# Patient Record
Sex: Female | Born: 2000 | Hispanic: No | Marital: Single
Health system: Southern US, Community
[De-identification: ages and names within clinical notes are randomized; demographics above are authoritative.]

---

## 2019-09-17 ENCOUNTER — Encounter: Payer: Self-pay | Admitting: Emergency Medicine

## 2019-09-17 ENCOUNTER — Emergency Department
Admission: EM | Admit: 2019-09-17 | Discharge: 2019-09-17 | Disposition: A | Discharge status: Home / Self Care | Payer: Medicaid Other | Attending: Emergency Medicine | Admitting: Emergency Medicine

## 2019-09-17 ENCOUNTER — Other Ambulatory Visit: Payer: Self-pay

## 2019-09-17 ENCOUNTER — Emergency Department: Payer: Medicaid Other

## 2019-09-17 DIAGNOSIS — R102 Pelvic and perineal pain: Secondary | ICD-10-CM | POA: Insufficient documentation

## 2019-09-17 LAB — URINALYSIS, COMPLETE (UACMP) WITH MICROSCOPIC
Bacteria, UA: NONE SEEN
Bilirubin Urine: NEGATIVE
Glucose, UA: NEGATIVE mg/dL
Ketones, ur: 5 mg/dL — AB
Leukocytes,Ua: NEGATIVE
Nitrite: NEGATIVE
Protein, ur: 100 mg/dL — AB
RBC / HPF: >50 RBC/hpf — ABNORMAL HIGH (ref 0–5)
Specific Gravity, Urine: 1.024 (ref 1.005–1.030)
pH: 5 (ref 5.0–8.0)

## 2019-09-17 LAB — COMPREHENSIVE METABOLIC PANEL
ALT: 12 U/L (ref 0–44)
AST: 15 U/L (ref 15–41)
Albumin: 4.5 g/dL (ref 3.5–5.0)
Alkaline Phosphatase: 68 U/L (ref 38–126)
Anion gap: 9 (ref 5–15)
BUN: 10 mg/dL (ref 6–20)
CO2: 24 mmol/L (ref 22–32)
Calcium: 9.1 mg/dL (ref 8.9–10.3)
Chloride: 104 mmol/L (ref 98–111)
Creatinine, Ser: 0.67 mg/dL (ref 0.44–1.00)
GFR calc Af Amer: >60 mL/min (ref >60)
GFR calc non Af Amer: >60 mL/min (ref >60)
Glucose, Bld: 95 mg/dL (ref 70–99)
Potassium: 3.8 mmol/L (ref 3.5–5.1)
Sodium: 137 mmol/L (ref 135–145)
Total Bilirubin: 0.8 mg/dL (ref 0.3–1.2)
Total Protein: 8 g/dL (ref 6.5–8.1)

## 2019-09-17 LAB — CBC
HCT: 43.4 % (ref 36.0–46.0)
Hemoglobin: 14.2 g/dL (ref 12.0–15.0)
MCH: 28.2 pg (ref 26.0–34.0)
MCHC: 32.7 g/dL (ref 30.0–36.0)
MCV: 86.1 fL (ref 80.0–100.0)
Platelets: 278 10*3/uL (ref 150–400)
RBC: 5.04 MIL/uL (ref 3.87–5.11)
RDW: 14.1 % (ref 11.5–15.5)
WBC: 6.4 10*3/uL (ref 4.0–10.5)
nRBC: 0 % (ref 0.0–0.2)

## 2019-09-17 LAB — POCT PREGNANCY, URINE: Preg Test, Ur: NEGATIVE

## 2019-09-17 LAB — LIPASE, BLOOD: Lipase: 21 U/L (ref 11–51)

## 2019-09-17 MED ORDER — IBUPROFEN 800 MG PO TABS: 800.0000 mg | ORAL_TABLET | Freq: Three times a day (TID) | ORAL | 0 refills | Status: AC | PRN

## 2019-09-17 NOTE — ED Notes (Signed)
PT to restroom

## 2019-09-17 NOTE — ED Triage Notes (Addendum)
Pt comes POV with lower abdominal pain for 3 weeks. Pt crying in triage. Stating that she has seen her doctor and he didn't do anything. Pt states the pain has been getting worse since then. Pt also states that her period started this morning.

## 2019-09-17 NOTE — ED Notes (Signed)
Pt c/o pelvic pain that started 3 weeks ago that has gotten worse since her period started today. Pt states she was seen 4-5 days ago by doctor who told her it could be ovarian cyst. Pt states the pain is worse when she stands.

## 2019-09-17 NOTE — ED Provider Notes (Signed)
Emergency Department Provider Note  ____________________________________________  Time seen: Approximately 3:13 PM  I have reviewed the triage vital signs and the nursing notes.   HISTORY  Chief Complaint Abdominal Pain   Historian Patient     HPI Andrea Atkins is a 19 y.o. female presents to the emergency department with left-sided pelvic pain for the past 3 weeks.  Patient states that she has addressed pelvic pain with her primary care provider and has a follow-up appointment in April but states that pain is pain worse over the past 24 hours.  She denies nausea or vomiting.  No fever at home.  No dysuria, hematuria or increased urinary frequency.  She denies low back pain.  She reports that her menses started yesterday.  She denies a history of ovarian torsion or ovarian cyst.  No dyspareunia or changes in vaginal discharge.  No other alleviating measures have been attempted.   History reviewed. No pertinent past medical history.   Immunizations up to date:  Yes.     History reviewed. No pertinent past medical history.  There are no problems to display for this patient.   History reviewed. No pertinent surgical history.  Prior to Admission medications   Medication Sig Start Date End Date Taking? Authorizing Provider  ibuprofen (ADVIL) 800 MG tablet Take 1 tablet (800 mg total) by mouth every 8 (eight) hours as needed. 09/17/19   Orvil Feil, PA-C    Allergies Patient has no known allergies.  History reviewed. No pertinent family history.  Social History Social History   Tobacco Use  . Smoking status: Not on file  Substance Use Topics  . Alcohol use: Not on file  . Drug use: Not on file     Review of Systems  Constitutional: No fever/chills Eyes:  No discharge ENT: No upper respiratory complaints. Respiratory: no cough. No SOB/ use of accessory muscles to breath Gastrointestinal:   No nausea, no vomiting.  No diarrhea.  No  constipation. Genitourinary: Patient has left sided pelvic pain.  Musculoskeletal: Negative for musculoskeletal pain. Skin: Negative for rash, abrasions, lacerations, ecchymosis.   ____________________________________________   PHYSICAL EXAM:  VITAL SIGNS: ED Triage Vitals  Enc Vitals Group     BP 09/17/19 1305 138/77     Pulse Rate 09/17/19 1305 98     Resp 09/17/19 1305 18     Temp 09/17/19 1305 98.1 F (36.7 C)     Temp Source 09/17/19 1305 Oral     SpO2 09/17/19 1305 99 %     Weight 09/17/19 1303 164 lb (74.4 kg)     Height 09/17/19 1303 5\' 5"  (1.651 m)     Head Circumference --      Peak Flow --      Pain Score 09/17/19 1303 10     Pain Loc --      Pain Edu? --      Excl. in GC? --      Constitutional: Alert and oriented. Well appearing and in no acute distress. Eyes: Conjunctivae are normal. PERRL. EOMI. Head: Atraumatic. Cardiovascular: Normal rate, regular rhythm. Normal S1 and S2.  Good peripheral circulation. Respiratory: Normal respiratory effort without tachypnea or retractions. Lungs CTAB. Good air entry to the bases with no decreased or absent breath sounds Gastrointestinal: Bowel sounds x 4 quadrants. Soft and nontender to palpation. No guarding or rigidity. No distention. Musculoskeletal: Full range of motion to all extremities. No obvious deformities noted Neurologic:  Normal for age. No gross focal neurologic deficits  are appreciated.  Skin:  Skin is warm, dry and intact. No rash noted. Psychiatric: Mood and affect are normal for age. Speech and behavior are normal.   ____________________________________________   LABS (all labs ordered are listed, but only abnormal results are displayed)  Labs Reviewed  URINALYSIS, COMPLETE (UACMP) WITH MICROSCOPIC - Abnormal; Notable for the following components:      Result Value   Color, Urine YELLOW (*)    APPearance HAZY (*)    Hgb urine dipstick LARGE (*)    Ketones, ur 5 (*)    Protein, ur 100 (*)     RBC / HPF >50 (*)    All other components within normal limits  LIPASE, BLOOD  COMPREHENSIVE METABOLIC PANEL  CBC  POC URINE PREG, ED  POCT PREGNANCY, URINE   ____________________________________________  EKG   ____________________________________________  RADIOLOGY Unk Pinto, personally viewed and evaluated these images (plain radiographs) as part of my medical decision making, as well as reviewing the written report by the radiologist.    US PELVIC COMPLETE W TRANSVAGINAL AND TORSION R/O  Result Date: 09/17/2019 CLINICAL DATA:  Pelvic pain for 5 days. EXAM: TRANSABDOMINAL AND TRANSVAGINAL ULTRASOUND OF PELVIS DUPLEX DOPPLER IMAGING OF THE OVARIES TECHNIQUE: Both transabdominal and transvaginal ultrasound examinations of the pelvis were performed. Transabdominal technique was performed for global imaging of the pelvis including uterus, ovaries, adnexal regions, and pelvic cul-de-sac. It was necessary to proceed with endovaginal exam following the transabdominal exam to visualize the endometrium and ovaries to better advantage. Doppler imaging performed of the ovaries. COMPARISON:  None FINDINGS: Uterus Measurements: 7.8 x 3.6 x 4.0 cm = volume: 59 mL. No fibroids or other mass visualized. Endometrium Thickness: 8 mm.  No focal abnormality visualized. Right ovary Measurements: 3.0 x 2.2 x 2.5 cm = volume: 9 mL. Normal appearance/no adnexal mass. Left ovary Measurements: 3.5 x 2.6 x 1.3 cm = volume: 6 mL. Normal appearance/no adnexal mass. Both ovaries demonstrate normal arterial and venous waveforms. Other findings No abnormal free fluid. IMPRESSION: Normal exam. No evidence of ovarian torsion. No findings to account for pelvic pain. Electronically Signed   By: Lajean Manes M.D.   On: 09/17/2019 16:02    ____________________________________________    PROCEDURES  Procedure(s) performed:     Procedures     Medications - No data to  display   ____________________________________________   INITIAL IMPRESSION / ASSESSMENT AND PLAN / ED COURSE  Pertinent labs & imaging results that were available during my care of the patient were reviewed by me and considered in my medical decision making (see chart for details).      Assessment and Plan:  Pelvic Pain:  19 year old female presents to the emergency department with pelvic pain on the left side that has occurred intermittently for the past 3 to 4 weeks.  On physical exam, abdomen is soft and nontender without guarding.  Vital signs are reassuring at triage.  Differential diagnosis included cystitis, ovarian cyst, ovarian torsion, unspecified pelvic pain...  Urinalysis revealed a large amount of blood which is consistent with patient's menses.  CBC revealed no leukocytosis.  CMP was reassuring.  Urine pregnancy testing was negative.  Pelvic ultrasound revealed no evidence of pelvic mass, pelvic abscess or ovarian torsion.  Patient was discharged with ibuprofen and advised to follow-up with OB/GYN regarding her pelvic pain.  Return precautions were given to return with new or worsening symptoms.  All patient questions were answered. ____________________________________________  FINAL CLINICAL IMPRESSION(S) / ED DIAGNOSES  Final diagnoses:  Pelvic pain      NEW MEDICATIONS STARTED DURING THIS VISIT:  ED Discharge Orders         Ordered    ibuprofen (ADVIL) 800 MG tablet  Every 8 hours PRN     09/17/19 1728              This chart was dictated using voice recognition software/Dragon. Despite best efforts to proofread, errors can occur which can change the meaning. Any change was purely unintentional.     Orvil Feil, PA-C 09/17/19 1756    Minna Antis, MD 09/19/19 2044

## 2020-12-18 IMAGING — US US PELVIS COMPLETE TRANSABD/TRANSVAG W DUPLEX
1 series · 13 of 25 positions shown · non-contrast
Comparison: None

CLINICAL DATA: Pelvic pain for 5 days.

EXAM:
TRANSABDOMINAL AND TRANSVAGINAL ULTRASOUND OF PELVIS
DUPLEX DOPPLER IMAGING OF THE OVARIES
TECHNIQUE: Both transabdominal and transvaginal ultrasound examinations of the
pelvis were performed. Transabdominal technique was performed for
global imaging of the pelvis including uterus, ovaries, adnexal
regions, and pelvic cul-de-sac. It was necessary to proceed with
endovaginal exam following the transabdominal exam to visualize the
endometrium and ovaries to better advantage. Doppler imaging
performed of the ovaries.

[Series 1: us pelvic complete w transvaginal and torsion righ · 13 of 73 slices shown]
[im 1/73]
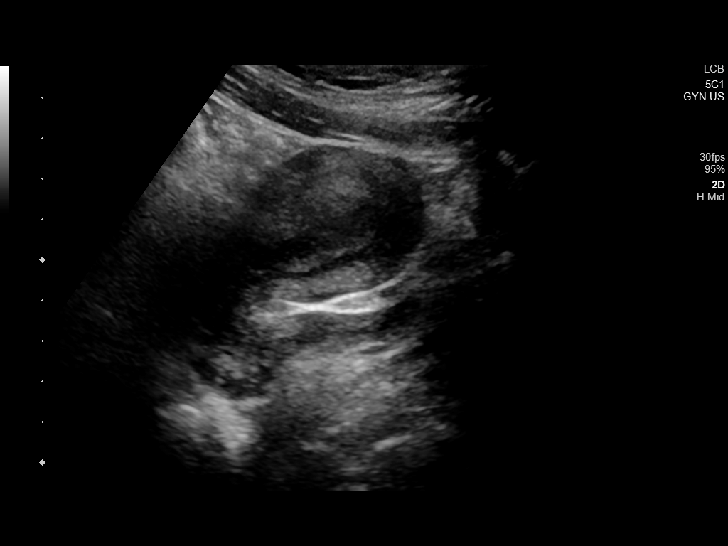
[im 7/73]
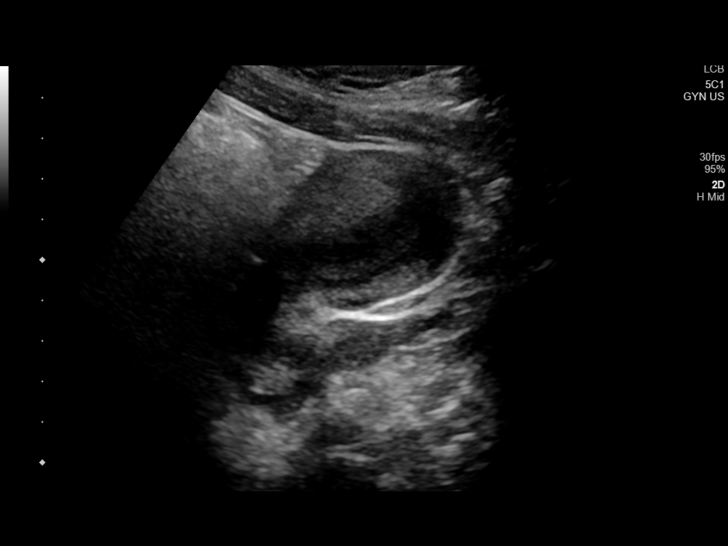
[im 13/73]
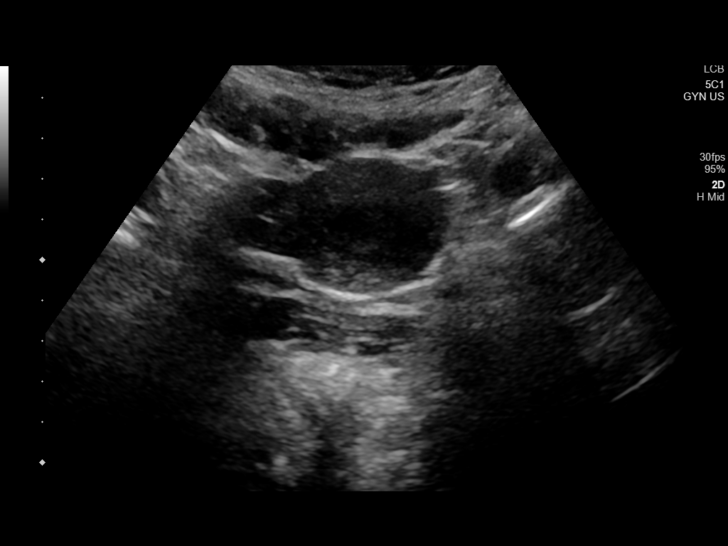
[im 19/73]
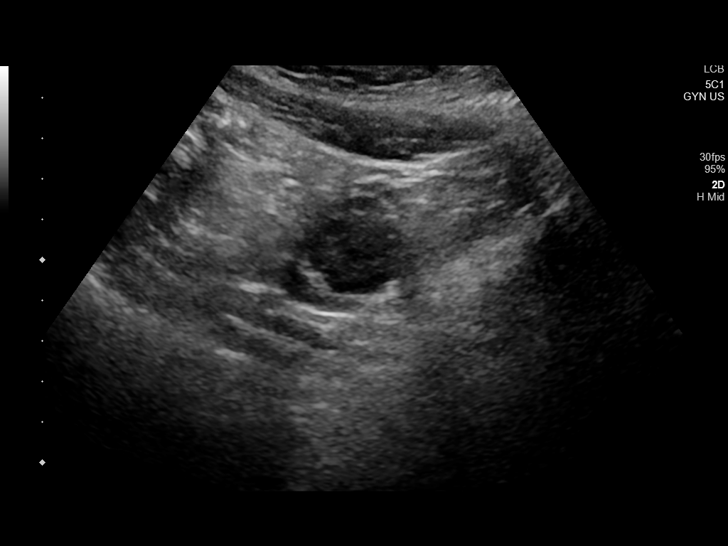
[im 25/73]
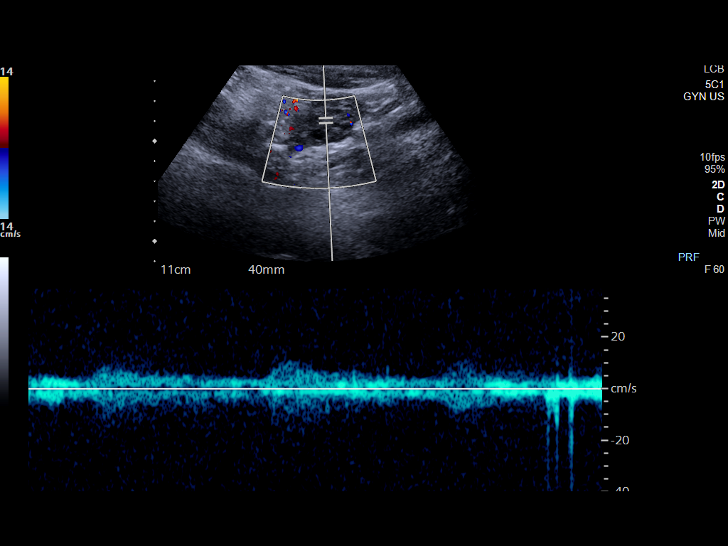
[im 31/73]
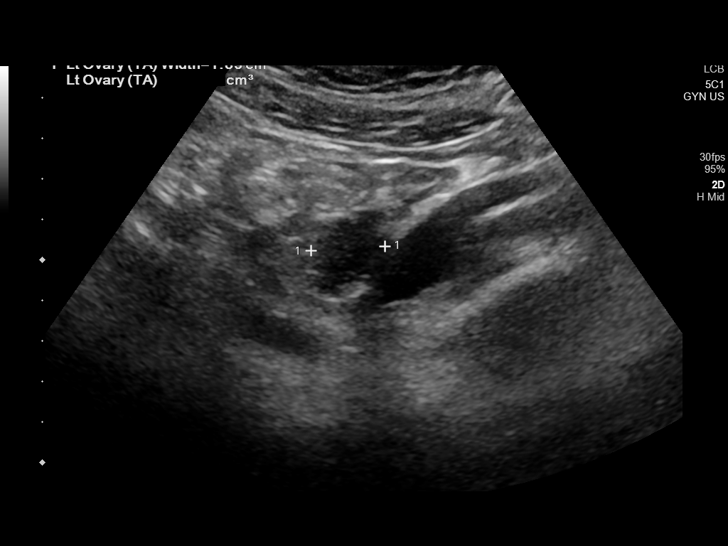
[im 37/73]
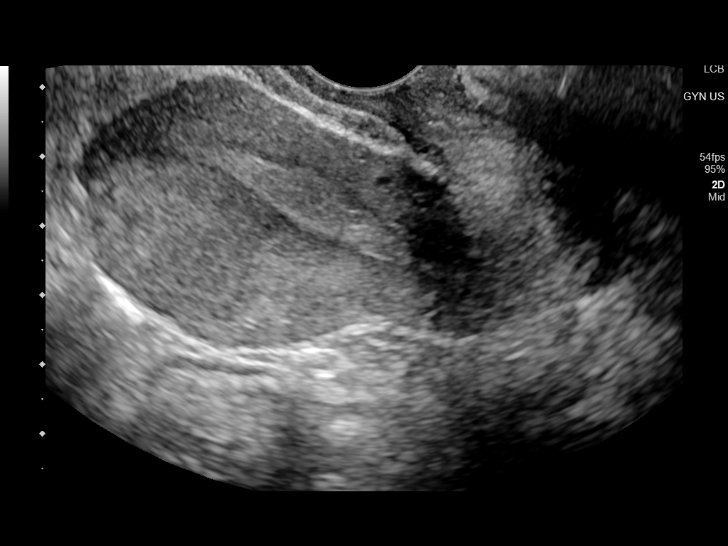
[im 43/73]
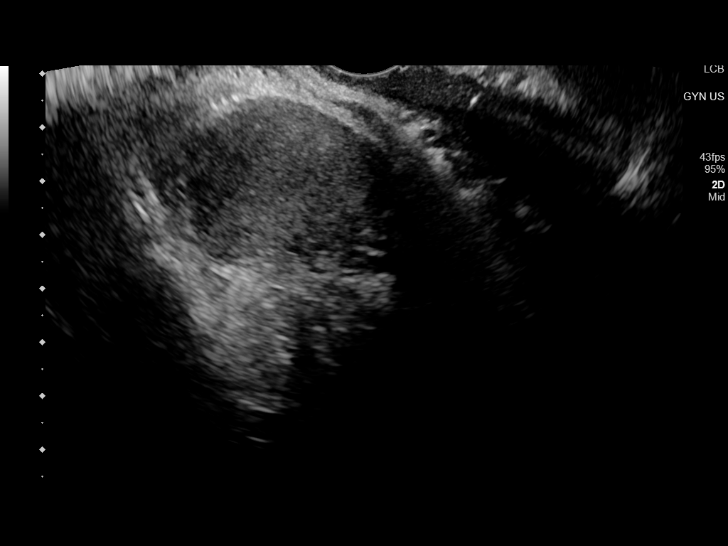
[im 49/73]
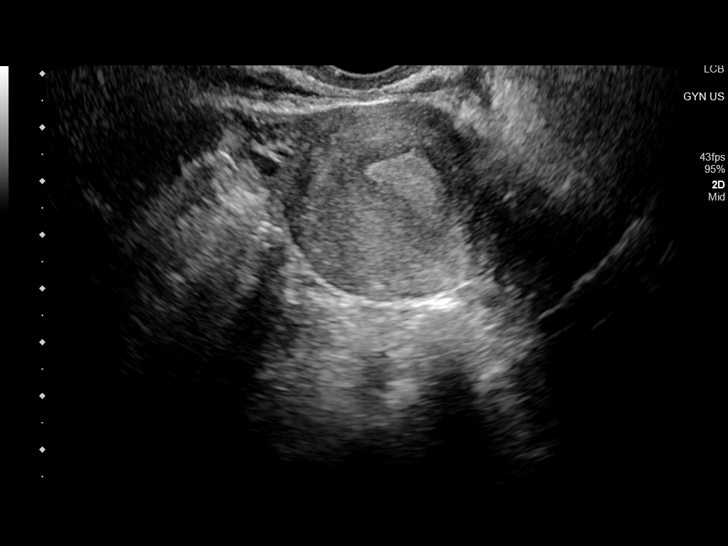
[im 55/73]
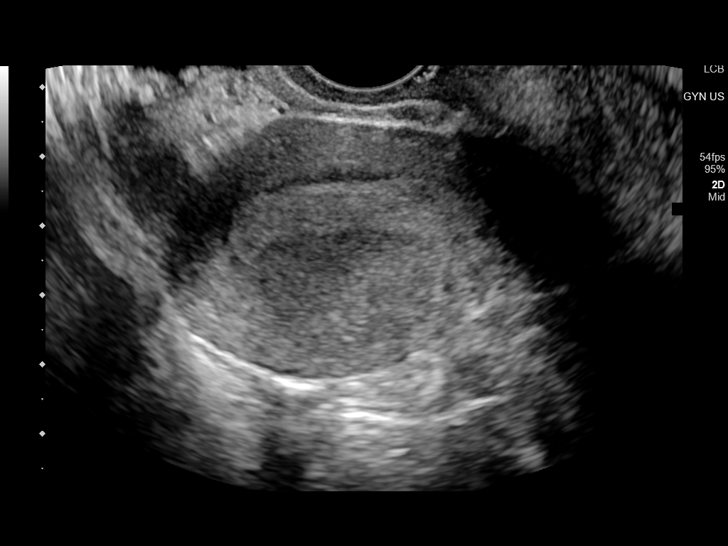
[im 61/73]
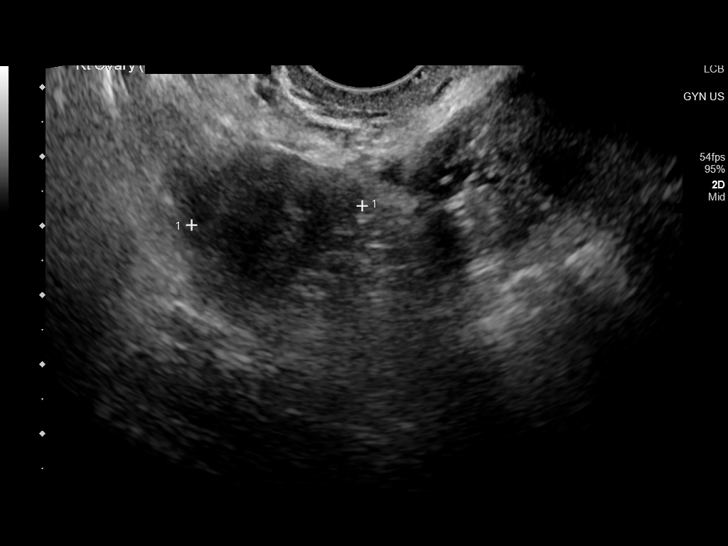
[im 67/73]
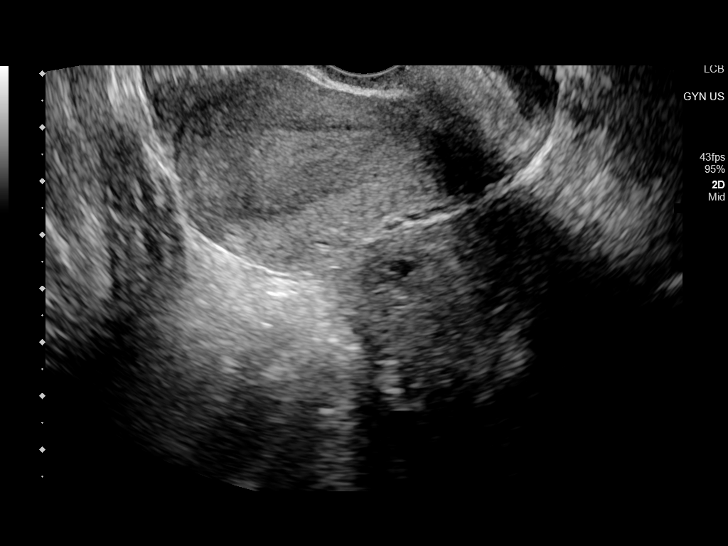
[im 73/73]
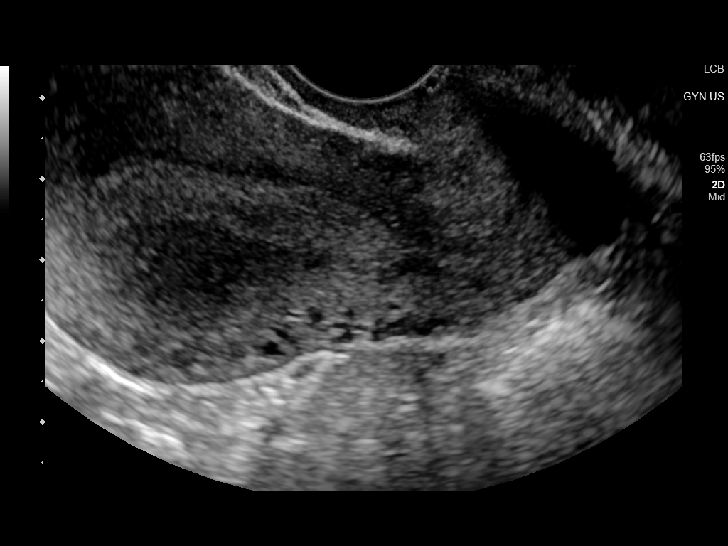

[13 of 25 positions shown; findings below may reference images not displayed]

FINDINGS: Uterus

Measurements: 7.8 x 3.6 x 4.0 cm = volume: 59 mL. No fibroids or
other mass visualized.

Endometrium

Thickness: 8 mm.  No focal abnormality visualized.

Right ovary

Measurements: 3.0 x 2.2 x 2.5 cm = volume: 9 mL. Normal
appearance/no adnexal mass.

Left ovary

Measurements: 3.5 x 2.6 x 1.3 cm = volume: 6 mL. Normal
appearance/no adnexal mass.

Both ovaries demonstrate normal arterial and venous waveforms.

Other findings

No abnormal free fluid.
IMPRESSION: Normal exam. No evidence of ovarian torsion. No findings to account
for pelvic pain.
